# Patient Record
Sex: Female | Born: 1980 | Race: Black or African American | Hispanic: No | Marital: Single | Smoking: Current some day smoker
Health system: Southern US, Community
[De-identification: ages and names within clinical notes are randomized; demographics above are authoritative.]

---

## 2006-05-28 ENCOUNTER — Emergency Department (HOSPITAL_COMMUNITY): Admission: EM | Admit: 2006-05-28 | Discharge: 2006-05-28 | Payer: Self-pay | Admitting: Emergency Medicine

## 2006-06-26 ENCOUNTER — Emergency Department (HOSPITAL_COMMUNITY): Admission: EM | Admit: 2006-06-26 | Discharge: 2006-06-26 | Payer: Self-pay | Admitting: *Deleted

## 2006-07-04 ENCOUNTER — Emergency Department (HOSPITAL_COMMUNITY): Admission: EM | Admit: 2006-07-04 | Discharge: 2006-07-04 | Payer: Self-pay | Admitting: Emergency Medicine

## 2006-11-10 ENCOUNTER — Emergency Department (HOSPITAL_COMMUNITY): Admission: EM | Admit: 2006-11-10 | Discharge: 2006-11-10 | Payer: Self-pay | Admitting: Emergency Medicine

## 2006-12-19 ENCOUNTER — Emergency Department (HOSPITAL_COMMUNITY): Admission: EM | Admit: 2006-12-19 | Discharge: 2006-12-19 | Payer: Self-pay | Admitting: Emergency Medicine

## 2006-12-20 ENCOUNTER — Emergency Department (HOSPITAL_COMMUNITY): Admission: EM | Admit: 2006-12-20 | Discharge: 2006-12-21 | Payer: Self-pay | Admitting: Emergency Medicine

## 2007-02-10 ENCOUNTER — Emergency Department (HOSPITAL_COMMUNITY): Admission: EM | Admit: 2007-02-10 | Discharge: 2007-02-10 | Payer: Self-pay | Admitting: Emergency Medicine

## 2007-04-13 ENCOUNTER — Emergency Department (HOSPITAL_COMMUNITY): Admission: EM | Admit: 2007-04-13 | Discharge: 2007-04-13 | Payer: Self-pay | Admitting: *Deleted

## 2007-05-15 ENCOUNTER — Emergency Department (HOSPITAL_COMMUNITY): Admission: EM | Admit: 2007-05-15 | Discharge: 2007-05-15 | Payer: Self-pay | Admitting: Emergency Medicine

## 2007-05-22 ENCOUNTER — Emergency Department (HOSPITAL_COMMUNITY): Admission: EM | Admit: 2007-05-22 | Discharge: 2007-05-22 | Payer: Self-pay | Admitting: Emergency Medicine

## 2007-06-20 ENCOUNTER — Emergency Department (HOSPITAL_COMMUNITY): Admission: EM | Admit: 2007-06-20 | Discharge: 2007-06-20 | Payer: Self-pay | Admitting: Emergency Medicine

## 2007-07-23 ENCOUNTER — Emergency Department (HOSPITAL_COMMUNITY): Admission: EM | Admit: 2007-07-23 | Discharge: 2007-07-23 | Payer: Self-pay | Admitting: Emergency Medicine

## 2008-01-06 ENCOUNTER — Emergency Department (HOSPITAL_COMMUNITY): Admission: EM | Admit: 2008-01-06 | Discharge: 2008-01-06 | Payer: Self-pay | Admitting: Emergency Medicine

## 2008-03-13 ENCOUNTER — Emergency Department (HOSPITAL_COMMUNITY): Admission: EM | Admit: 2008-03-13 | Discharge: 2008-03-13 | Payer: Self-pay | Admitting: Emergency Medicine

## 2008-05-11 ENCOUNTER — Emergency Department (HOSPITAL_COMMUNITY): Admission: EM | Admit: 2008-05-11 | Discharge: 2008-05-11 | Payer: Self-pay | Admitting: Emergency Medicine

## 2008-05-17 ENCOUNTER — Emergency Department (HOSPITAL_COMMUNITY): Admission: EM | Admit: 2008-05-17 | Discharge: 2008-05-17 | Payer: Self-pay | Admitting: Emergency Medicine

## 2008-10-02 ENCOUNTER — Emergency Department (HOSPITAL_COMMUNITY): Admission: EM | Admit: 2008-10-02 | Discharge: 2008-10-02 | Payer: Self-pay | Admitting: Family Medicine

## 2008-12-20 ENCOUNTER — Emergency Department (HOSPITAL_COMMUNITY): Admission: EM | Admit: 2008-12-20 | Discharge: 2008-12-21 | Payer: Self-pay | Admitting: Emergency Medicine

## 2009-03-23 ENCOUNTER — Encounter: Admission: RE | Admit: 2009-03-23 | Discharge: 2009-05-29 | Payer: Self-pay | Admitting: Orthopedic Surgery

## 2010-11-12 IMAGING — CR DG SHOULDER 2+V*R*
2 series · 2 of 2 positions shown · non-contrast
Comparison: 05/17/2008

CLINICAL DATA: Right shoulder pain, possible dislocation.

RIGHT SHOULDER - 2+ VIEW

[w shoulder ap internal righ]
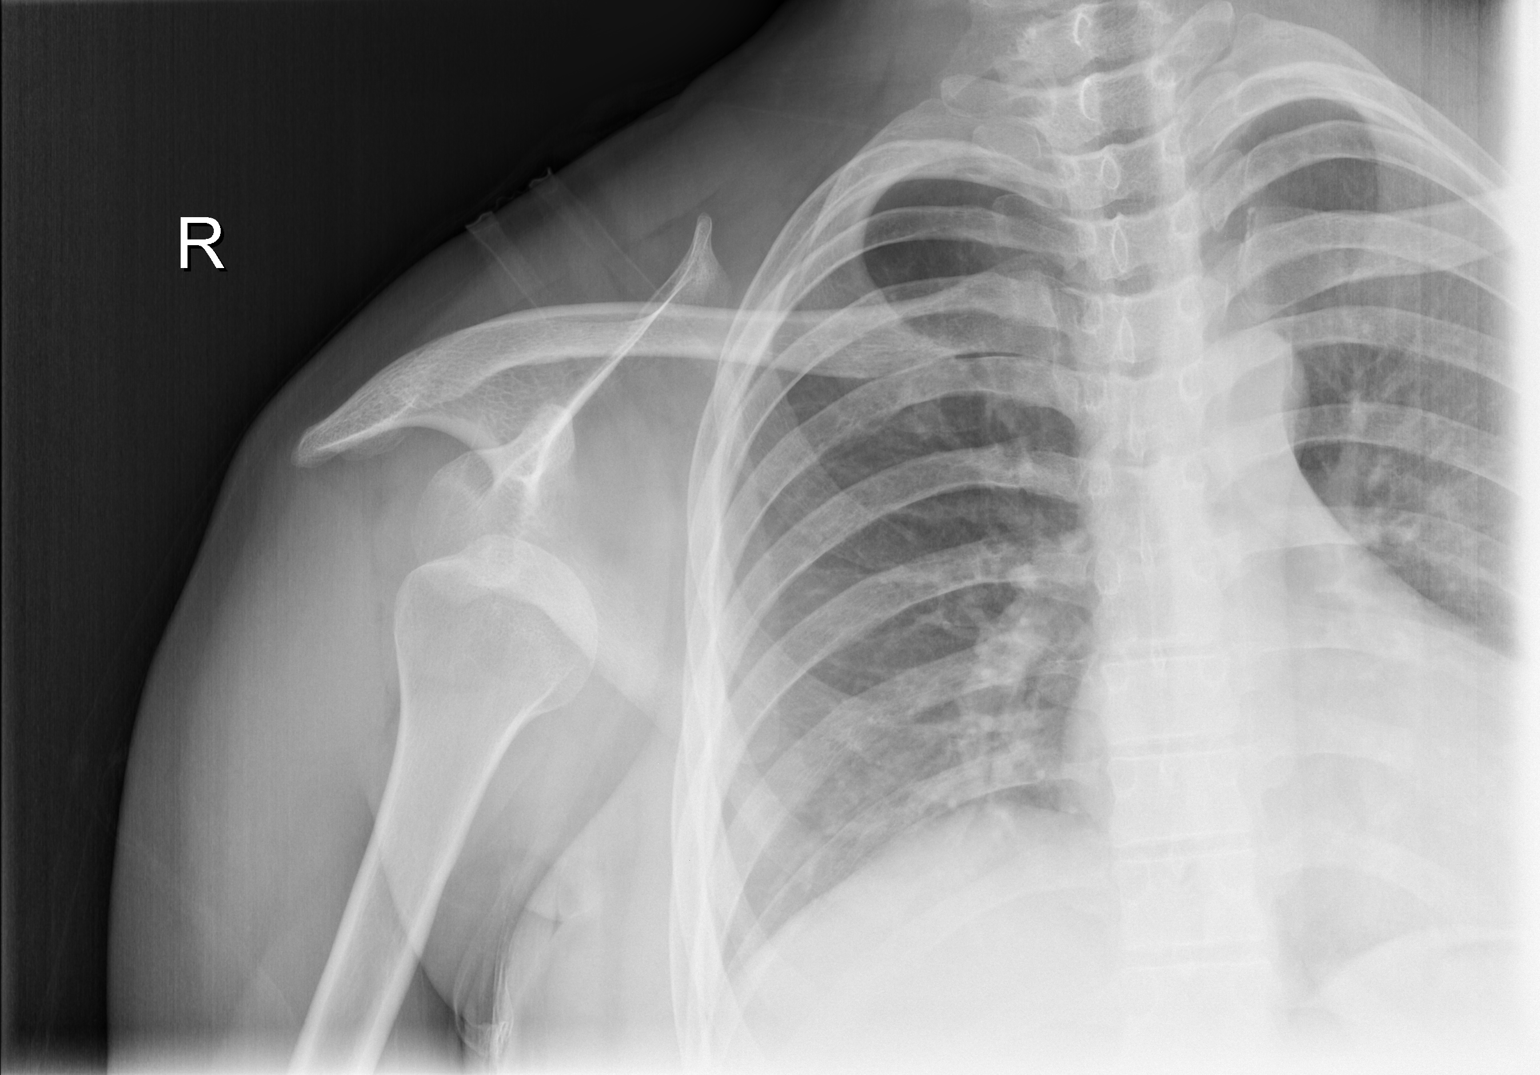

[w shoulder y view right]
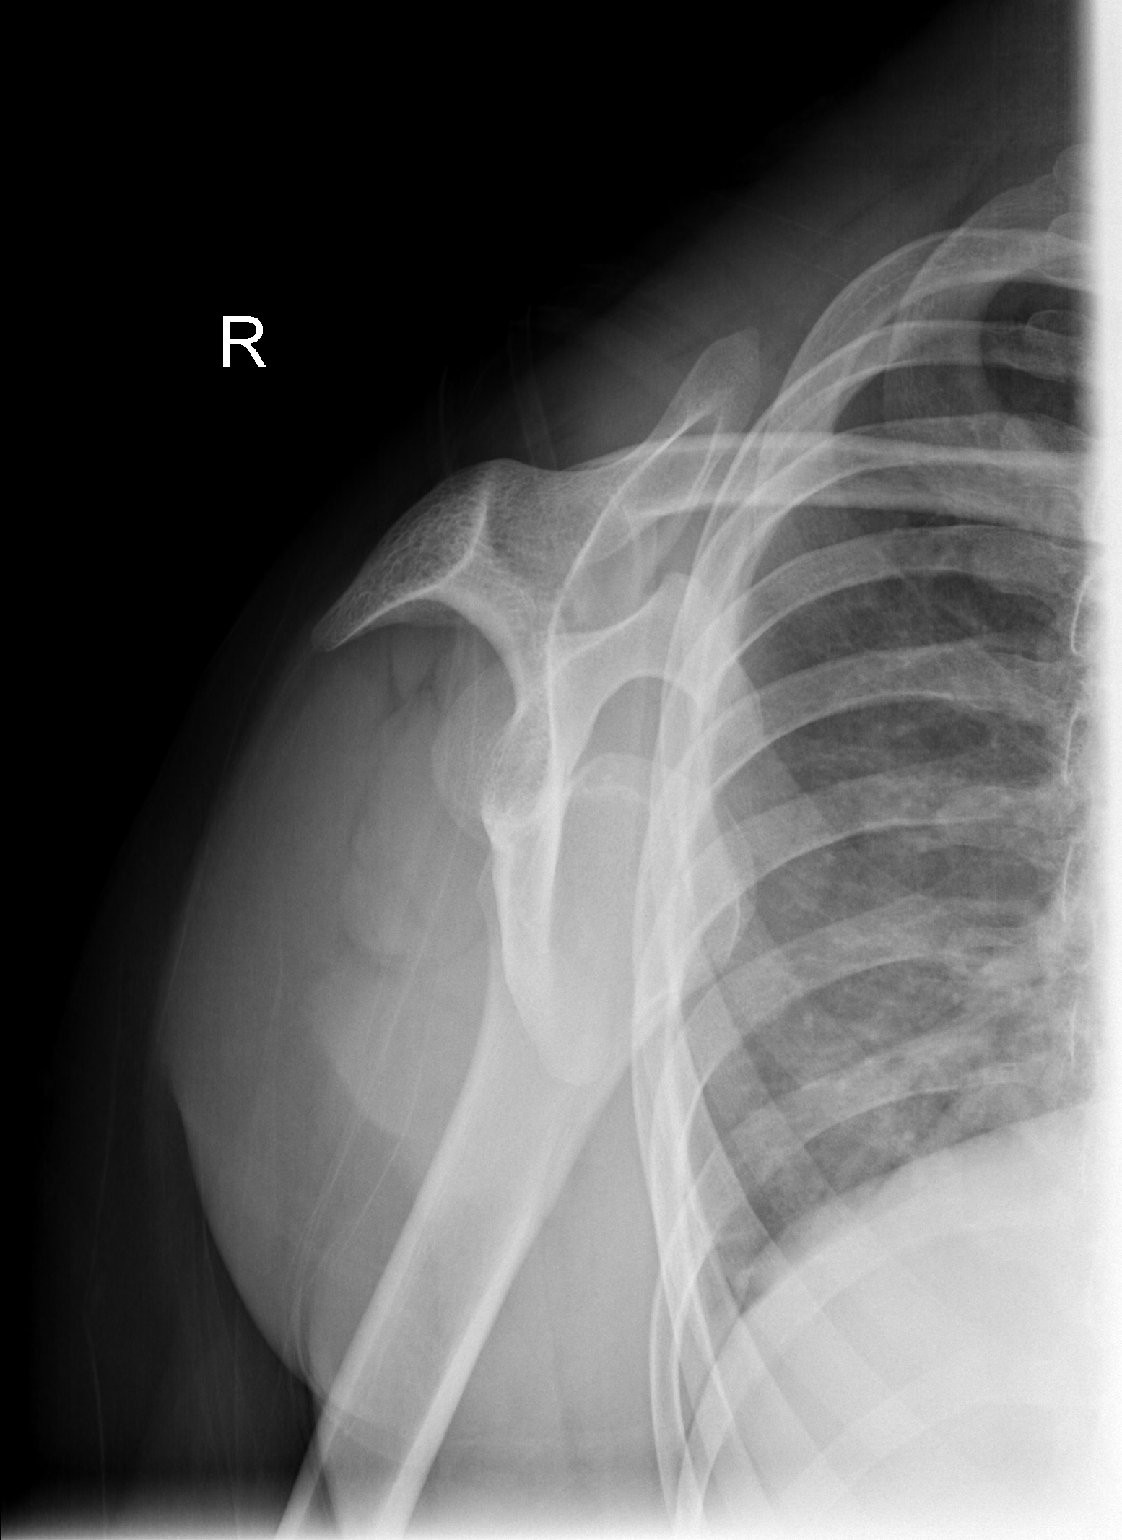

[2 of 2 positions shown; findings below may reference images not displayed]

FINDINGS: The right humeral head is dislocated anteriorly and
inferiorly relative to the glenoid.  No fractures seen.  Recommend
post reduction views.  Visualized right hemithorax unremarkable.
IMPRESSION: Anterior right shoulder dislocation.

## 2018-12-24 DIAGNOSIS — K219 Gastro-esophageal reflux disease without esophagitis: Secondary | ICD-10-CM | POA: Insufficient documentation

## 2019-01-14 ENCOUNTER — Ambulatory Visit (HOSPITAL_COMMUNITY): Payer: Self-pay | Admitting: Psychiatry

## 2019-01-15 DIAGNOSIS — F99 Mental disorder, not otherwise specified: Secondary | ICD-10-CM

## 2019-01-15 DIAGNOSIS — F419 Anxiety disorder, unspecified: Secondary | ICD-10-CM | POA: Insufficient documentation

## 2019-01-15 DIAGNOSIS — F5105 Insomnia due to other mental disorder: Secondary | ICD-10-CM | POA: Insufficient documentation

## 2019-01-24 ENCOUNTER — Ambulatory Visit (INDEPENDENT_AMBULATORY_CARE_PROVIDER_SITE_OTHER): Payer: 59 | Admitting: Psychiatry

## 2019-01-24 ENCOUNTER — Other Ambulatory Visit: Payer: Self-pay

## 2019-01-24 ENCOUNTER — Encounter (HOSPITAL_COMMUNITY): Payer: Self-pay | Admitting: Psychiatry

## 2019-01-24 VITALS — BP 124/78 | HR 70 | Ht 70.0 in | Wt 188.0 lb

## 2019-01-24 DIAGNOSIS — F4310 Post-traumatic stress disorder, unspecified: Secondary | ICD-10-CM | POA: Diagnosis not present

## 2019-01-24 DIAGNOSIS — F5102 Adjustment insomnia: Secondary | ICD-10-CM

## 2019-01-24 DIAGNOSIS — F411 Generalized anxiety disorder: Secondary | ICD-10-CM

## 2019-01-24 MED ORDER — ESCITALOPRAM OXALATE 10 MG PO TABS: 10.0000 mg | ORAL_TABLET | Freq: Every day | ORAL | 0 refills | Status: DC
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
## 2019-01-24 NOTE — Patient Instructions (Signed)
Refer to therapy 

## 2019-02-15 ENCOUNTER — Other Ambulatory Visit (HOSPITAL_COMMUNITY): Payer: Self-pay | Admitting: Psychiatry

## 2019-02-15 ENCOUNTER — Ambulatory Visit (HOSPITAL_COMMUNITY): Payer: Self-pay | Admitting: Psychiatry

## 2019-02-18 ENCOUNTER — Ambulatory Visit (HOSPITAL_COMMUNITY): Payer: 59 | Admitting: Psychiatry

## 2019-02-26 ENCOUNTER — Ambulatory Visit (HOSPITAL_COMMUNITY): Payer: 59 | Admitting: Psychiatry

## 2019-03-11 ENCOUNTER — Ambulatory Visit (HOSPITAL_COMMUNITY): Payer: 59 | Admitting: Licensed Clinical Social Worker

## 2019-03-12 ENCOUNTER — Encounter (HOSPITAL_COMMUNITY): Payer: Self-pay | Admitting: Psychiatry

## 2019-03-12 ENCOUNTER — Other Ambulatory Visit: Payer: Self-pay

## 2019-03-12 ENCOUNTER — Ambulatory Visit (INDEPENDENT_AMBULATORY_CARE_PROVIDER_SITE_OTHER): Payer: 59 | Admitting: Psychiatry

## 2019-03-12 DIAGNOSIS — F5102 Adjustment insomnia: Secondary | ICD-10-CM

## 2019-03-12 DIAGNOSIS — F411 Generalized anxiety disorder: Secondary | ICD-10-CM

## 2019-03-12 DIAGNOSIS — F4310 Post-traumatic stress disorder, unspecified: Secondary | ICD-10-CM

## 2019-03-12 MED ORDER — ESCITALOPRAM OXALATE 20 MG PO TABS: 20.0000 mg | ORAL_TABLET | Freq: Every day | ORAL | 0 refills | Status: DC
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
## 2019-04-06 ENCOUNTER — Other Ambulatory Visit (HOSPITAL_COMMUNITY): Payer: Self-pay | Admitting: Psychiatry
